# Patient Record
Sex: Male | Born: 1975 | Race: White | Marital: Married | Smoking: Never smoker
Health system: Southern US, Community
[De-identification: ages and names within clinical notes are randomized; demographics above are authoritative.]

---

## 2015-03-23 ENCOUNTER — Emergency Department
Admission: EM | Admit: 2015-03-23 | Discharge: 2015-03-23 | Disposition: A | Discharge status: Home / Self Care | Payer: BLUE CROSS/BLUE SHIELD | Attending: Emergency Medicine | Admitting: Emergency Medicine

## 2015-03-23 ENCOUNTER — Emergency Department: Payer: BLUE CROSS/BLUE SHIELD

## 2015-03-23 ENCOUNTER — Encounter: Payer: Self-pay | Admitting: Emergency Medicine

## 2015-03-23 DIAGNOSIS — W51XXXA Accidental striking against or bumped into by another person, initial encounter: Secondary | ICD-10-CM | POA: Insufficient documentation

## 2015-03-23 DIAGNOSIS — S46911A Strain of unspecified muscle, fascia and tendon at shoulder and upper arm level, right arm, initial encounter: Secondary | ICD-10-CM | POA: Insufficient documentation

## 2015-03-23 DIAGNOSIS — Y9289 Other specified places as the place of occurrence of the external cause: Secondary | ICD-10-CM | POA: Insufficient documentation

## 2015-03-23 DIAGNOSIS — M779 Enthesopathy, unspecified: Secondary | ICD-10-CM | POA: Insufficient documentation

## 2015-03-23 DIAGNOSIS — Y998 Other external cause status: Secondary | ICD-10-CM | POA: Diagnosis not present

## 2015-03-23 DIAGNOSIS — Y9364 Activity, baseball: Secondary | ICD-10-CM | POA: Insufficient documentation

## 2015-03-23 DIAGNOSIS — S4991XA Unspecified injury of right shoulder and upper arm, initial encounter: Secondary | ICD-10-CM | POA: Diagnosis present

## 2015-03-23 DIAGNOSIS — M778 Other enthesopathies, not elsewhere classified: Secondary | ICD-10-CM

## 2015-03-23 DIAGNOSIS — M7581 Other shoulder lesions, right shoulder: Secondary | ICD-10-CM

## 2015-03-23 MED ORDER — CYCLOBENZAPRINE HCL 5 MG PO TABS: 5.0000 mg | ORAL_TABLET | Freq: Three times a day (TID) | ORAL | Status: AC | PRN

## 2015-03-23 MED ORDER — KETOROLAC TROMETHAMINE 10 MG PO TABS: 10.0000 mg | ORAL_TABLET | Freq: Three times a day (TID) | ORAL | Status: AC

## 2015-03-23 NOTE — ED Notes (Signed)
Pt alert and oriented X4, active, cooperative, pt in NAD. RR even and unlabored, color WNL.  Pt informed to return if any life threatening symptoms occur.  Left with wife. 

## 2015-03-23 NOTE — ED Provider Notes (Signed)
[facility] Emergency Department Provider Note ____________________________________________  Time seen: 1805  I have reviewed the triage vital signs and the nursing notes.  HISTORY  Chief Complaint  Shoulder Injury  HPI Jared Mendoza is a 39 y.o. male with reports of pain to the right shoulder, after he was collided into, by the knee of another player while playing softball. He notes increased pain on movement of the shoulder, but denies any distal paresthesias, dislocation, catch, click, or lock.  History reviewed. No pertinent past medical history.  There are no active problems to display for this patient.  History reviewed. No pertinent past surgical history.  Current Outpatient Rx  Name  Route  Sig  Dispense  Refill  . cyclobenzaprine (FLEXERIL) 5 MG tablet   Oral   Take 1 tablet (5 mg total) by mouth every 8 (eight) hours as needed for muscle spasms.   12 tablet   0   . ketorolac (TORADOL) 10 MG tablet   Oral   Take 1 tablet (10 mg total) by mouth every 8 (eight) hours.   15 tablet   0    Allergies Review of patient's allergies indicates no known allergies.  No family history on file.  Social History History  Substance Use Topics  . Smoking status: Never Smoker   . Smokeless tobacco: Current User    Types: Chew  . Alcohol Use: Yes   Review of Systems  Constitutional: Negative for fever. Eyes: Negative for visual changes. ENT: Negative for sore throat. Cardiovascular: Negative for chest pain. Respiratory: Negative for shortness of breath. Gastrointestinal: Negative for abdominal pain, vomiting and diarrhea. Genitourinary: Negative for dysuria. Musculoskeletal: Negative for back pain. Right shoulder pain as above Skin: Negative for rash. Neurological: Negative for headaches, focal weakness or numbness. ____________________________________________  PHYSICAL EXAM:  VITAL SIGNS: ED Triage Vitals  Enc Vitals Group     BP  03/23/15 1550 145/88 mmHg     Pulse Rate 03/23/15 1550 94     Resp 03/23/15 1550 18     Temp 03/23/15 1550 98.3 F (36.8 C)     Temp Source 03/23/15 1550 Oral     SpO2 03/23/15 1550 96 %     Weight 03/23/15 1550 171 lb (77.565 kg)     Height 03/23/15 1550  (1.727 m)     Head Cir --      Peak Flow --      Pain Score 03/23/15 1557 7     Pain Loc --      Pain Edu? --      Excl. in GC? --    Constitutional: Alert and oriented. Well appearing and in no distress. Eyes: Conjunctivae are normal. PERRL. Normal extraocular movements. ENT   Head: Normocephalic and atraumatic.   Nose: No congestion/rhinnorhea.   Mouth/Throat: Mucous membranes are moist.   Neck: Supple. No thyromegaly. Hematological/Lymphatic/Immunilogical: No cervical lymphadenopathy. Cardiovascular: Normal rate, regular rhythm.  Respiratory: Normal respiratory effort.  Musculoskeletal: Nontender with normal range of motion in right shoulder except decreased extension due to pain. Normal right shoulder without deformity. Normal rotator cuff strength testing without laxity. Normal Apley scratch test, negative empty can test. Normal grip bilaterally.  Neurologic:  Normal gait without ataxia. Normal speech and language. No gross focal neurologic deficits are appreciated. Skin:  Skin is warm, dry and intact. No rash noted. Psychiatric: Mood and affect are normal. Patient exhibits appropriate insight and judgment. ____________________________________________   RADIOLOGY Right Shoulder IMPRESSION: Negative.  I, Maaz Spiering  Marcelyn Bruins, personally viewed and evaluated these images as part of my medical decision making.  ____________________________________________  INITIAL IMPRESSION / ASSESSMENT AND PLAN / ED COURSE  Radiology results to patient. Right shoulder strain s/p contusion. Treatment with Toradol and Flexeril.  Referral to Dr. Hyacinth Meeker for ongoing problems. Shoulder sling applied for support.   ____________________________________________  FINAL CLINICAL IMPRESSION(S) / ED DIAGNOSES  Final diagnoses:  Shoulder strain, right, initial encounter  Shoulder tendinitis, right     Lissa Hoard, PA-C 03/23/15 1848  Phineas Semen, MD 03/23/15 2107

## 2015-03-23 NOTE — Discharge Instructions (Signed)
Rotator Cuff Tendinitis  Rotator cuff tendinitis is inflammation of the tough, cord-like bands that connect muscle to bone (tendons) in your rotator cuff. Your rotator cuff is the collection of all the muscles and tendons that connect your arm to your shoulder. Your rotator cuff holds the head of your upper arm bone (humerus) in the cup (fossa) of your shoulder blade (scapula). CAUSES Rotator cuff tendinitis is usually caused by overusing the joint involved.  SIGNS AND SYMPTOMS  Deep ache in the shoulder also felt on the outside upper arm over the shoulder muscle.  Point tenderness over the area that is injured.  Pain comes on gradually and becomes worse with lifting the arm to the side (abduction) or turning it inward (internal rotation).  May lead to a chronic tear: When a rotator cuff tendon becomes inflamed, it runs the risk of losing its blood supply, causing some tendon fibers to die. This increases the risk that the tendon can fray and partially or completely tear. DIAGNOSIS Rotator cuff tendinitis is diagnosed by taking a medical history, performing a physical exam, and reviewing results of imaging exams. The medical history is useful to help determine the type of rotator cuff injury. The physical exam will include looking at the injured shoulder, feeling the injured area, and watching you do range-of-motion exercises. X-ray exams are typically done to rule out other causes of shoulder pain, such as fractures. MRI is the imaging exam usually used for significant shoulder injuries. Sometimes a dye study called CT arthrogram is done, but it is not as widely used as MRI. In some institutions, special ultrasound tests may also be used to aid in the diagnosis. TREATMENT  Less Severe Cases  Use of a sling to rest the shoulder for a short period of time. Prolonged use of the sling can cause stiffness, weakness, and loss of motion of the shoulder joint.  Anti-inflammatory medicines, such as  ibuprofen or naproxen sodium, may be prescribed. More Severe Cases  Physical therapy.  Use of steroid injections into the shoulder joint.  Surgery. HOME CARE INSTRUCTIONS   Use a sling or splint until the pain decreases. Prolonged use of the sling can cause stiffness, weakness, and loss of motion of the shoulder joint.  Apply ice to the injured area:  Put ice in a plastic bag.  Place a towel between your skin and the bag.  Leave the ice on for 20 minutes, 2-3 times a day.  Try to avoid use other than gentle range of motion while your shoulder is painful. Use the shoulder and exercise only as directed by your health care provider. Stop exercises or range of motion if pain or discomfort increases, unless directed otherwise by your health care provider.  Only take over-the-counter or prescription medicines for pain, discomfort, or fever as directed by your health care provider.  If you were given a shoulder sling and straps (immobilizer), do not remove it except as directed, or until you see a health care provider for a follow-up exam. If you need to remove it, move your arm as little as possible or as directed.  You may want to sleep on several pillows at night to lessen swelling and pain. SEEK IMMEDIATE MEDICAL CARE IF:   Your shoulder pain increases or new pain develops in your arm, hand, or fingers and is not relieved with medicines.  You have new, unexplained symptoms, especially increased numbness in the hands or loss of strength.  You develop any worsening of the problems  that brought you in for care.  Your arm, hand, or fingers are numb or tingling.  Your arm, hand, or fingers are swollen, painful, or turn white or blue. MAKE SURE YOU:  Understand these instructions.  Will watch your condition.  Will get help right away if you are not doing well or get worse. Document Released: 11/07/2003 Document Revised: 06/07/2013 Document Reviewed: 03/29/2013 [facility] Patient  Information 2015 [place], [place]. This information is not intended to replace advice given to you by your health care provider. Make sure you discuss any questions you have with your health care provider.  Shoulder Sprain A shoulder sprain is the result of damage to the tough, fiber-like tissues (ligaments) that help hold your shoulder in place. The ligaments may be stretched or torn. Besides the main shoulder joint (the ball and socket), there are several smaller joints that connect the bones in this area. A sprain usually involves one of those joints. Most often it is the acromioclavicular (or AC) joint. That is the joint that connects the collarbone (clavicle) and the shoulder blade (scapula) at the top point of the shoulder blade (acromion). A shoulder sprain is a mild form of what is called a shoulder separation. Recovering from a shoulder sprain may take some time. For some, pain lingers for several months. Most people recover without long term problems. CAUSES   A shoulder sprain is usually caused by some kind of trauma. This might be:  Falling on an outstretched arm.  Being hit hard on the shoulder.  Twisting the arm.  Shoulder sprains are more likely to occur in people who:  Play sports.  Have balance or coordination problems. SYMPTOMS   Pain when you move your shoulder.  Limited ability to move the shoulder.  Swelling and tenderness on top of the shoulder.  Redness or warmth in the shoulder.  Bruising.  A change in the shape of the shoulder. DIAGNOSIS  Your healthcare provider may:  Ask about your symptoms.  Ask about recent activity that might have caused those symptoms.  Examine your shoulder. You may be asked to do simple exercises to test movement. The other shoulder will be examined for comparison.  Order some tests that provide a look inside the body. They can show the extent of the injury. The tests could include:  X-rays.  CT (computed tomography)  scan.  MRI (magnetic resonance imaging) scan. RISKS AND COMPLICATIONS  Loss of full shoulder motion.  Ongoing shoulder pain. TREATMENT  How long it takes to recover from a shoulder sprain depends on how severe it was. Treatment options may include:  Rest. You should not use the arm or shoulder until it heals.  Ice. For 2 or 3 days after the injury, put an ice pack on the shoulder up to 4 times a day. It should stay on for 15 to 20 minutes each time. Wrap the ice in a towel so it does not touch your skin.  Over-the-counter medicine to relieve pain.  A sling or brace. This will keep the arm still while the shoulder is healing.  Physical therapy or rehabilitation exercises. These will help you regain strength and motion. Ask your healthcare provider when it is OK to begin these exercises.  Surgery. The need for surgery is rare with a sprained shoulder, but some people may need surgery to keep the joint in place and reduce pain. HOME CARE INSTRUCTIONS   Ask your healthcare provider about what you should and should not do while your shoulder  heals.  Make sure you know how to apply ice to the correct area of your shoulder.  Talk with your healthcare provider about which medications should be used for pain and swelling.  If rehabilitation therapy will be needed, ask your healthcare provider to refer you to a therapist. If it is not recommended, then ask about at-home exercises. Find out when exercise should begin. SEEK MEDICAL CARE IF:  Your pain, swelling, or redness at the joint increases. SEEK IMMEDIATE MEDICAL CARE IF:   You have a fever.  You cannot move your arm or shoulder. Document Released: 01/03/2009 Document Revised: 11/09/2011 Document Reviewed: 01/03/2009 [facility] Patient Information 2015 [place], [place]. This information is not intended to replace advice given to you by your health care provider. Make sure you discuss any questions you have with your health care  provider.  Continue to apply ice to reduce symptoms.  Take the prescription meds as directed for pain and spasm. Follow-up with Dr. Hyacinth Meeker for ongoing shoulder pain and discomfort. Wear the sling as needed for support.

## 2015-03-23 NOTE — ED Notes (Signed)
Pt reports he was playing baseball and another person ran into his right shoulder. Increased pain with movement no deformity noted.

## 2015-12-15 IMAGING — CR DG SHOULDER 2+V*R*
1 series · 3 of 3 positions shown · non-contrast
Comparison: None.

CLINICAL DATA: Trauma to the right shoulder playing softball, pain

EXAM:
RIGHT SHOULDER - 2+ VIEW

[Series 1: dg shoulder right · 0.14mm/px · 3 of 3 slices shown]
[im 1/3]
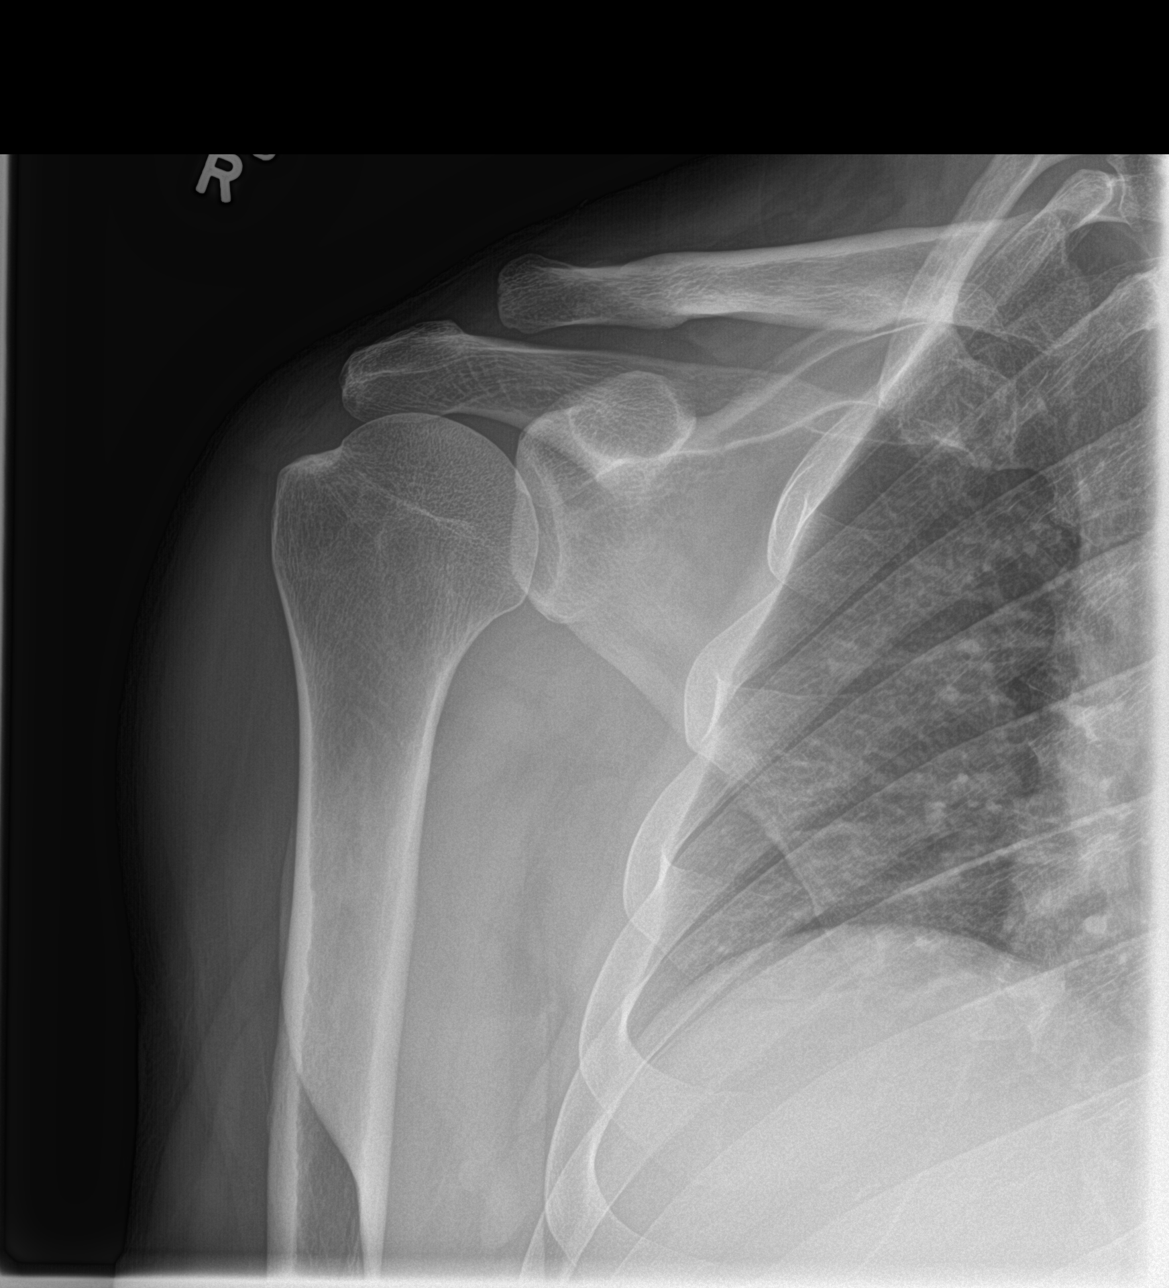
[im 2/3]
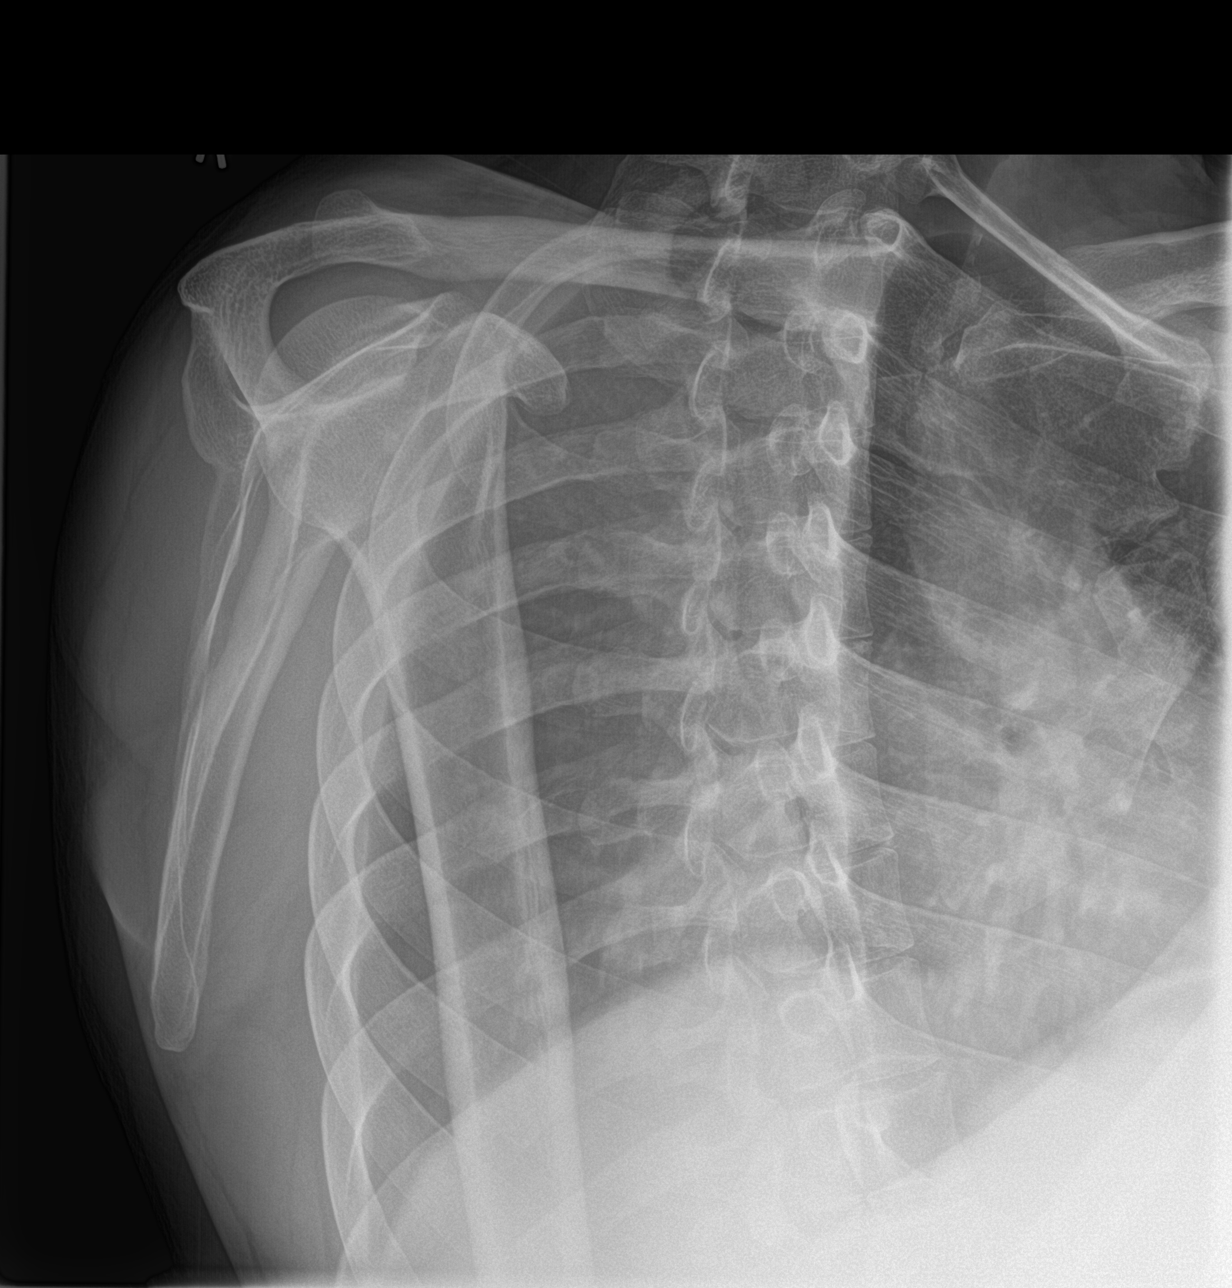
[im 3/3]
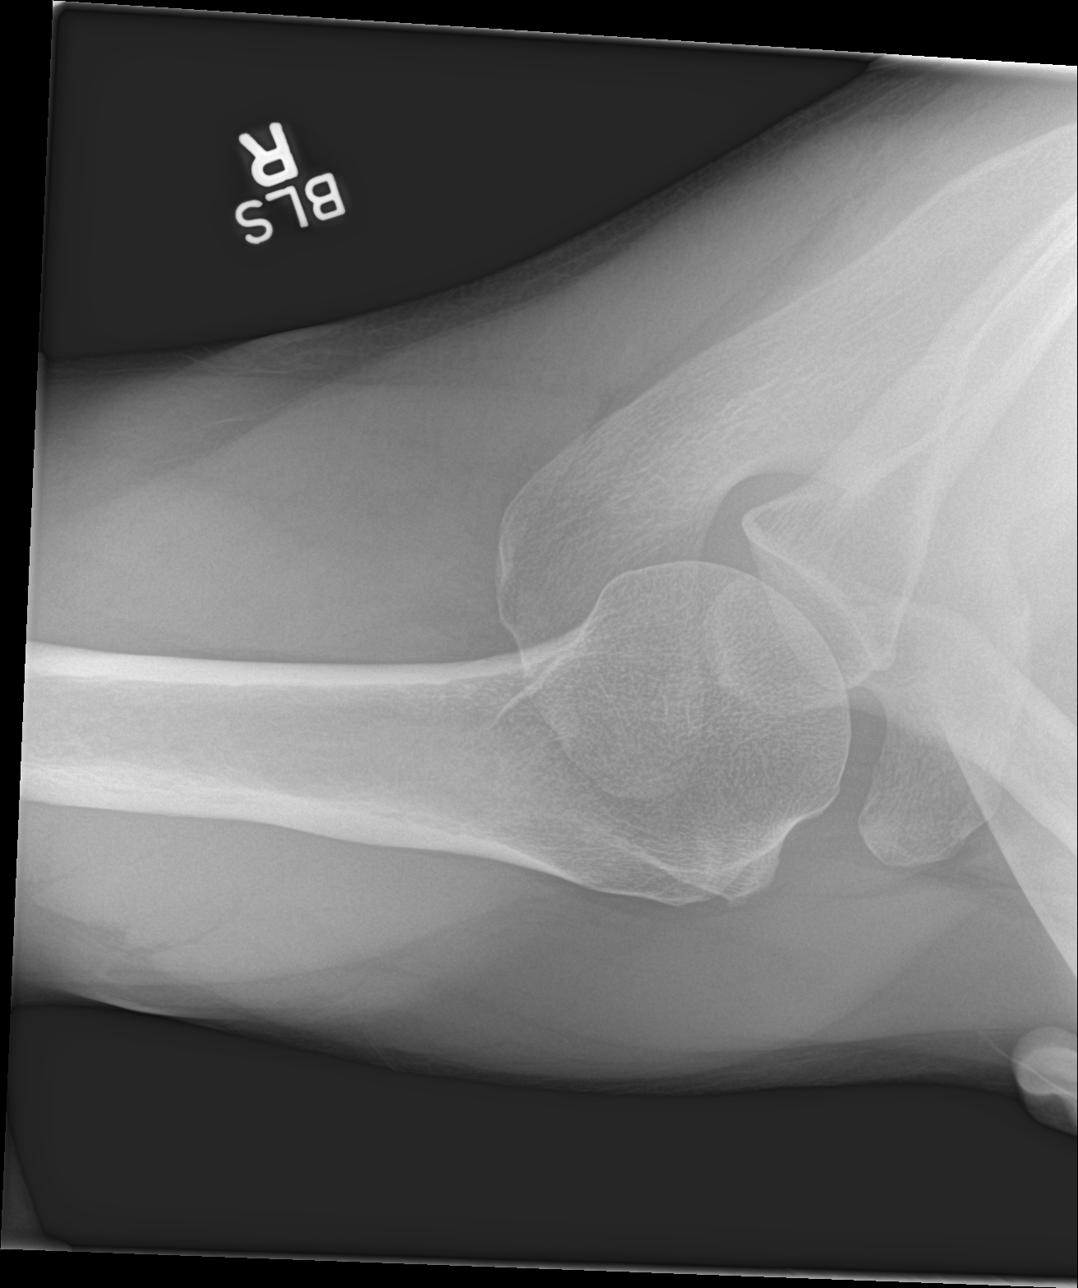

[3 of 3 positions shown; findings below may reference images not displayed]

FINDINGS: There is no evidence of fracture or dislocation. There is no
evidence of arthropathy or other focal bone abnormality. Soft
tissues are unremarkable.
IMPRESSION: Negative.
